# Patient Record
Sex: Female | Born: 1980 | Race: White | Hispanic: No | Marital: Married | Smoking: Never smoker
Health system: Southern US, Community
[De-identification: ages and names within clinical notes are randomized; demographics above are authoritative.]

## PROBLEM LIST (undated history)

## (undated) HISTORY — PX: TUBAL LIGATION: SHX77

---

## 2006-08-18 ENCOUNTER — Observation Stay: Payer: Self-pay

## 2006-11-09 ENCOUNTER — Observation Stay: Payer: Self-pay | Admitting: Obstetrics & Gynecology

## 2006-12-20 ENCOUNTER — Inpatient Hospital Stay: Payer: Self-pay

## 2007-02-11 ENCOUNTER — Emergency Department: Payer: Self-pay | Admitting: Unknown Physician Specialty

## 2009-04-21 ENCOUNTER — Ambulatory Visit: Payer: Self-pay | Admitting: Obstetrics and Gynecology

## 2009-04-26 ENCOUNTER — Emergency Department: Payer: Self-pay | Admitting: Internal Medicine

## 2009-04-27 ENCOUNTER — Ambulatory Visit: Payer: Self-pay | Admitting: Internal Medicine

## 2009-11-22 ENCOUNTER — Observation Stay: Payer: Self-pay

## 2009-11-30 ENCOUNTER — Ambulatory Visit: Payer: Self-pay

## 2009-12-01 ENCOUNTER — Inpatient Hospital Stay: Payer: Self-pay

## 2010-05-08 ENCOUNTER — Emergency Department: Payer: Self-pay | Admitting: Emergency Medicine

## 2011-11-05 ENCOUNTER — Ambulatory Visit: Payer: Self-pay | Admitting: Family Medicine

## 2014-11-03 ENCOUNTER — Ambulatory Visit (INDEPENDENT_AMBULATORY_CARE_PROVIDER_SITE_OTHER): Payer: BLUE CROSS/BLUE SHIELD

## 2014-11-03 ENCOUNTER — Encounter: Payer: Self-pay | Admitting: Podiatry

## 2014-11-03 ENCOUNTER — Ambulatory Visit (INDEPENDENT_AMBULATORY_CARE_PROVIDER_SITE_OTHER): Payer: BLUE CROSS/BLUE SHIELD | Admitting: Podiatry

## 2014-11-03 VITALS — BP 99/66 | HR 66 | Resp 12

## 2014-11-03 DIAGNOSIS — R52 Pain, unspecified: Secondary | ICD-10-CM

## 2014-11-03 MED ORDER — MELOXICAM 15 MG PO TABS: 15.0000 mg | ORAL_TABLET | Freq: Every day | ORAL | Status: DC

## 2014-11-03 MED ORDER — METHYLPREDNISOLONE (PAK) 4 MG PO TABS: ORAL_TABLET | ORAL | Status: DC

## 2014-11-03 NOTE — Progress Notes (Signed)
   Subjective:    Patient ID: Lori Hendricks, female    DOB: 07/17/1981, 34 y.o.   MRN: [id number]  HPI  PT STATED B/L BOTTOM OF THE HEELS AND LT TOP OF THE FOOT IS BEEN PAINFUL FOR 4 MONTHS. FEET ARE GETTING PAINFUL ESPECIALLY GETTING UP IN THE MORNING OR FLEXING WHEN DRIVING. TRIED MASSAGE AND GOOD SHOES BUT NO HELP.  Review of Systems  All other systems reviewed and are negative.      Objective:   Physical Exam: I have reviewed her past mental history medications allergies surgery social history and review of systems. Pulses are strongly palpable bilateral. Neurologic sensorium is intact. Deep tendon reflex are intact. Muscle strength is 5 over 5 dorsiflexion plantar flexors and inverters everters all into the musculature is intact. Orthopedic evaluation demonstrates pain on palpation central band of plantar fascia bilateral left greater than right she also has pain on palpation of the sinus tarsi of the left foot with mild tenderness on end range of motion of the subtalar joint left. Radiographic evaluation does demonstrate what appears to be a chronic inflammatory event to the plantar fascial calcaneal insertion sites bilateral.        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  Plan: We injected the left heel today and place her on a Medrol Dosepak to be followed by meloxicam. He has a night splint at home which she will utilize and we dispensed a plantar fascial day brace. I will follow-up with her in the near future for discussion.

## 2014-11-24 ENCOUNTER — Encounter: Payer: Self-pay | Admitting: Podiatry

## 2014-11-24 ENCOUNTER — Ambulatory Visit (INDEPENDENT_AMBULATORY_CARE_PROVIDER_SITE_OTHER): Payer: BLUE CROSS/BLUE SHIELD | Admitting: Podiatry

## 2014-11-24 VITALS — BP 100/63 | HR 62 | Resp 16

## 2014-11-24 DIAGNOSIS — M722 Plantar fascial fibromatosis: Secondary | ICD-10-CM | POA: Diagnosis not present

## 2014-11-25 NOTE — Progress Notes (Signed)
She presents today for a follow-up of plantar fasciitis. She states that one of her braces broke and she would like to consider purchasing to night splints. She states that she is approximately 70-75% improved however she is not well as of yet.  Objective: Vital signs are stable she is alert and oriented 3 mild tenderness on palpation of the medial calcaneal tubercles bilateral. There is no erythema edema or ecchymosis.  Assessment: Plantar fasciitis bilateral.  Plan: Encouraged her to continue all of her anti-inflammatories all of her conservative therapies until she is 1 month pain-free. I will follow-up with her as needed. We dispensed to night splints for her.

## 2014-12-16 ENCOUNTER — Ambulatory Visit: Payer: Self-pay | Admitting: Endocrinology

## 2014-12-27 ENCOUNTER — Encounter: Payer: Self-pay | Admitting: Podiatry

## 2014-12-27 ENCOUNTER — Ambulatory Visit (INDEPENDENT_AMBULATORY_CARE_PROVIDER_SITE_OTHER): Payer: BLUE CROSS/BLUE SHIELD | Admitting: Podiatry

## 2014-12-27 VITALS — BP 105/67 | HR 64 | Resp 16 | Ht 61.0 in

## 2014-12-27 DIAGNOSIS — M722 Plantar fascial fibromatosis: Secondary | ICD-10-CM

## 2014-12-27 MED ORDER — DICLOFENAC SODIUM 75 MG PO TBEC: 75.0000 mg | DELAYED_RELEASE_TABLET | Freq: Two times a day (BID) | ORAL | Status: AC

## 2014-12-27 NOTE — Progress Notes (Signed)
At this point she states that her left foot is regressing 75% at her last visit to approximately 50% at this point. She states that my right heel is now starting to hurt.  Objective: Vital signs are stable she is alert and oriented 3. Pulses are strongly palpable bilateral. Pain on palpation medial calcaneal tubercles bilateral.  Assessment: Plantar fasciitis bilateral left greater than right.  Plan: She was scanned today for set up her orthotics. I will follow-up with her in 1 month when those come in for an injection of the bilateral heels. I also will discontinue her meloxicam and start her on diclofenac.

## 2014-12-31 ENCOUNTER — Ambulatory Visit
Admit: 2014-12-31 | Disposition: A | Discharge status: Home / Self Care | Payer: Self-pay | Attending: Family Medicine | Admitting: Family Medicine

## 2014-12-31 LAB — CBC WITH DIFFERENTIAL/PLATELET
BASOS ABS: 0 10*3/uL (ref 0.0–0.1)
Basophil %: 0.3 %
Eosinophil #: 0.2 10*3/uL (ref 0.0–0.7)
Eosinophil %: 1.6 %
HCT: 37.7 % (ref 35.0–47.0)
HGB: 12.5 g/dL (ref 12.0–16.0)
LYMPHS ABS: 2.4 10*3/uL (ref 1.0–3.6)
Lymphocyte %: 18.7 %
MCH: 29.2 pg (ref 26.0–34.0)
MCHC: 33.1 g/dL (ref 32.0–36.0)
MCV: 88 fL (ref 80–100)
MONO ABS: 0.7 x10 3/mm (ref 0.2–0.9)
Monocyte %: 5.5 %
Neutrophil #: 9.4 10*3/uL — ABNORMAL HIGH (ref 1.4–6.5)
Neutrophil %: 73.9 %
PLATELETS: 292 10*3/uL (ref 150–440)
RBC: 4.27 10*6/uL (ref 3.80–5.20)
RDW: 13.3 % (ref 11.5–14.5)
WBC: 12.7 10*3/uL — AB (ref 3.6–11.0)

## 2014-12-31 LAB — URINALYSIS, COMPLETE
Blood: NEGATIVE
Glucose,UR: NEGATIVE
Leukocyte Esterase: NEGATIVE
Nitrite: NEGATIVE
PH: 6 (ref 5.0–8.0)
PROTEIN: NEGATIVE
SPECIFIC GRAVITY: 1.025 (ref 1.000–1.030)

## 2014-12-31 LAB — COMPREHENSIVE METABOLIC PANEL
ANION GAP: 10 (ref 7–16)
AST: 22 U/L
Albumin: 4.5 g/dL
Alkaline Phosphatase: 10 U/L — ABNORMAL LOW
BILIRUBIN TOTAL: 0.6 mg/dL
BUN: 12 mg/dL
CALCIUM: 9.5 mg/dL
CHLORIDE: 102 mmol/L
Co2: 24 mmol/L
Creatinine: 0.75 mg/dL
EGFR (African American): >60
GLUCOSE: 96 mg/dL
POTASSIUM: 4.2 mmol/L
SGPT (ALT): 17 U/L
SODIUM: 136 mmol/L
Total Protein: 7.5 g/dL

## 2014-12-31 LAB — AMYLASE: Amylase: 24 U/L — ABNORMAL LOW

## 2014-12-31 LAB — LIPASE, BLOOD: LIPASE: 23 U/L

## 2014-12-31 LAB — PREGNANCY, URINE: Pregnancy Test, Urine: NEGATIVE m[IU]/mL

## 2015-01-01 ENCOUNTER — Emergency Department
Admit: 2015-01-01 | Disposition: A | Discharge status: Home / Self Care | Payer: Self-pay | Admitting: Emergency Medicine

## 2015-01-01 LAB — COMPREHENSIVE METABOLIC PANEL
ALBUMIN: 4 g/dL
ALT: 18 U/L
AST: 23 U/L
Alkaline Phosphatase: 11 U/L — ABNORMAL LOW
Anion Gap: 10 (ref 7–16)
BILIRUBIN TOTAL: 0.6 mg/dL
BUN: 11 mg/dL
CREATININE: 0.83 mg/dL
Calcium, Total: 9.4 mg/dL
Chloride: 103 mmol/L
Co2: 25 mmol/L
EGFR (African American): >60
GLUCOSE: 89 mg/dL
POTASSIUM: 4 mmol/L
SODIUM: 138 mmol/L
TOTAL PROTEIN: 7.3 g/dL

## 2015-01-01 LAB — URINALYSIS, COMPLETE
BILIRUBIN, UR: NEGATIVE
Blood: NEGATIVE
Glucose,UR: NEGATIVE mg/dL (ref 0–75)
KETONE: NEGATIVE
LEUKOCYTE ESTERASE: NEGATIVE
Nitrite: NEGATIVE
Ph: 6 (ref 4.5–8.0)
Protein: NEGATIVE
Specific Gravity: 1.016 (ref 1.003–1.030)

## 2015-01-01 LAB — CBC WITH DIFFERENTIAL/PLATELET
BASOS ABS: 0 10*3/uL (ref 0.0–0.1)
BASOS PCT: 0.7 %
Eosinophil #: 0.3 10*3/uL (ref 0.0–0.7)
Eosinophil %: 4.1 %
HCT: 38.6 % (ref 35.0–47.0)
HGB: 12.6 g/dL (ref 12.0–16.0)
LYMPHS ABS: 1.8 10*3/uL (ref 1.0–3.6)
Lymphocyte %: 27.8 %
MCH: 29.2 pg (ref 26.0–34.0)
MCHC: 32.7 g/dL (ref 32.0–36.0)
MCV: 89 fL (ref 80–100)
MONO ABS: 0.4 x10 3/mm (ref 0.2–0.9)
MONOS PCT: 6 %
NEUTROS ABS: 4.1 10*3/uL (ref 1.4–6.5)
Neutrophil %: 61.4 %
Platelet: 305 10*3/uL (ref 150–440)
RBC: 4.33 10*6/uL (ref 3.80–5.20)
RDW: 13.6 % (ref 11.5–14.5)
WBC: 6.6 10*3/uL (ref 3.6–11.0)

## 2015-01-01 LAB — LIPASE, BLOOD: Lipase: 48 U/L

## 2015-01-07 ENCOUNTER — Ambulatory Visit (INDEPENDENT_AMBULATORY_CARE_PROVIDER_SITE_OTHER): Payer: BLUE CROSS/BLUE SHIELD | Admitting: Podiatry

## 2015-01-07 DIAGNOSIS — M722 Plantar fascial fibromatosis: Secondary | ICD-10-CM

## 2015-01-07 MED ORDER — TRIAMCINOLONE ACETONIDE 10 MG/ML IJ SUSP
10.0000 mg | Freq: Once | INTRAMUSCULAR | Status: AC
  Administered 2015-01-07: 10 mg

## 2015-01-07 NOTE — Patient Instructions (Signed)

## 2015-01-10 NOTE — Progress Notes (Signed)
Subjective:     Patient ID: Lori Hendricks, female   DOB: 09/16/1981, 34 y.o.   MRN: [id number]  HPI patient presents with pain in the heels of both feet stating that she should've got injections as lateral visit as they are really bothering her   Review of Systems     Objective:   Physical Exam Neurovascular status intact with severe discomfort plantar aspect heel bilateral with inflammation and fluid around the medial band at its insertion into the calcaneus    Assessment:     Plantar fasciitis bilateral with inflammation and fluid buildup    Plan:     Injected the plantar fascia bilateral 3 mg Kenalog 5 mg Xylocaine and instructed on physical therapy. Reappoint for us to recheck again in the next several weeks and begin orthotic usage with instructions

## 2015-01-17 ENCOUNTER — Ambulatory Visit: Payer: BLUE CROSS/BLUE SHIELD | Admitting: Podiatry

## 2015-02-09 ENCOUNTER — Ambulatory Visit (INDEPENDENT_AMBULATORY_CARE_PROVIDER_SITE_OTHER): Payer: BLUE CROSS/BLUE SHIELD | Admitting: Podiatry

## 2015-02-09 DIAGNOSIS — M722 Plantar fascial fibromatosis: Secondary | ICD-10-CM | POA: Diagnosis not present

## 2015-02-09 MED ORDER — MELOXICAM 15 MG PO TABS: 15.0000 mg | ORAL_TABLET | Freq: Every day | ORAL | Status: DC

## 2015-02-09 NOTE — Progress Notes (Signed)
She presents today for follow-up of her plantar fasciitis bilateral. She states that they still hurt some but nothing like that. She continues to wear her orthotics or regular basis and she states that she loves them. She was unable to take the diclofenac with severe abdominal pain. She is requesting meloxicam once again.  Objective: Vital signs are stable she is alert and oriented 3. Pulses are palpable bilateral. She is no pain on palpation medial calcaneal tubercles bilateral.  Assessment: Well-healing plantar fasciitis bilateral.  Plan: Wrote her another prescription for 90 days worth of meloxicam. Continue to wear the orthotics on a regular basis. Follow up with me as needed.

## 2015-03-23 ENCOUNTER — Ambulatory Visit: Payer: BLUE CROSS/BLUE SHIELD | Admitting: Podiatry

## 2015-06-14 ENCOUNTER — Telehealth: Payer: Self-pay | Admitting: *Deleted

## 2015-06-14 MED ORDER — MELOXICAM 15 MG PO TABS: 15.0000 mg | ORAL_TABLET | Freq: Every day | ORAL | Status: AC

## 2015-06-14 NOTE — Telephone Encounter (Addendum)
Pt states her pharmacy has not been able to get the Meloxicam refilled.  I left message informing pt I had her pharmacy on record as [facility] and [facility]  I contacted the pharmacy and pt has refills available, they do not understand pt's complaint.  I informed pt I would refill the rx for a year in [place], but she would need to call.

## 2016-12-27 ENCOUNTER — Telehealth: Payer: Self-pay | Admitting: Obstetrics & Gynecology

## 2016-12-27 NOTE — Telephone Encounter (Signed)
Pt needs an Appt. For [employer] Prescription not authorized due to Dr. Luella Cook no longer with [facility].

## 2016-12-28 ENCOUNTER — Ambulatory Visit: Payer: BLUE CROSS/BLUE SHIELD | Admitting: Obstetrics and Gynecology

## 2017-11-18 ENCOUNTER — Other Ambulatory Visit: Payer: Self-pay

## 2017-11-18 ENCOUNTER — Ambulatory Visit
Admission: EM | Admit: 2017-11-18 | Discharge: 2017-11-18 | Disposition: A | Discharge status: Home / Self Care | Payer: BLUE CROSS/BLUE SHIELD | Attending: Family Medicine | Admitting: Family Medicine

## 2017-11-18 ENCOUNTER — Encounter: Payer: Self-pay | Admitting: Emergency Medicine

## 2017-11-18 DIAGNOSIS — K1121 Acute sialoadenitis: Secondary | ICD-10-CM

## 2017-11-18 DIAGNOSIS — R6884 Jaw pain: Secondary | ICD-10-CM | POA: Diagnosis not present

## 2017-11-18 MED ORDER — AMOXICILLIN-POT CLAVULANATE 875-125 MG PO TABS: 1.0000 | ORAL_TABLET | Freq: Two times a day (BID) | ORAL | 0 refills | Status: AC

## 2017-11-18 NOTE — ED Triage Notes (Signed)
Patient states she has been sick with the flu for the past week and a half. Saturday her right jaw started swelling and became painful.

## 2017-11-18 NOTE — ED Provider Notes (Signed)
[facility]    CSN: [id number] Arrival date & time: 11/18/17  0804     History   Chief Complaint Chief Complaint  Patient presents with  . Jaw Pain    HPI Lori Hendricks is a 37 y.o. female.   37 yo female with a c/o right sided jaw pain and swelling for the past 2 days. Denies any injuries, trauma. Had the flu last week.    The history is provided by the patient.    History reviewed. No pertinent past medical history.  There are no active problems to display for this patient.   Past Surgical History:  Procedure Laterality Date  . REPEAT CESAREAN SECTION    . TUBAL LIGATION      OB History    No data available       Home Medications    Prior to Admission medications   Medication Sig Start Date End Date Taking? Authorizing Provider  norelgestromin-ethinyl estradiol (ORTHO EVRA) 150-35 MCG/24HR transdermal patch Place 1 patch onto the skin once a week.   Yes [provider]  paroxetine (PAXIL) 10 MG/5ML suspension Take by mouth every morning.   Yes [provider]  amoxicillin-clavulanate (AUGMENTIN) 875-125 MG tablet Take 1 tablet by mouth 2 (two) times daily. 11/18/17   Payton Mccallumonty, Rafael Quesada, MD  diclofenac (VOLTAREN) 75 MG EC tablet Take 1 tablet (75 mg total) by mouth 2 (two) times daily. Patient not taking: Reported on 01/07/2015 12/27/14   Ernestene KielHyatt, Max T, DPM  meloxicam (MOBIC) 15 MG tablet Take 1 tablet (15 mg total) by mouth daily. 06/14/15   Hyatt, Max T, DPM  naproxen (NAPROSYN) 500 MG tablet Take 500 mg by mouth 2 (two) times daily with a meal.    [provider]  norelgestromin-ethinyl estradiol (ORTHO EVRA) 150-35 MCG/24HR transdermal patch Place 1 patch onto the skin once a week.    [provider]    Family History Family History  Problem Relation Age of Onset  . Cancer Father   . Hyperlipidemia Father     Social History Social History   Tobacco Use  . Smoking status: Never Smoker  . Smokeless  tobacco: Never Used  Substance Use Topics  . Alcohol use: No  . Drug use: No     Allergies   Diclofenac   Review of Systems Review of Systems   Physical Exam Triage Vital Signs ED Triage Vitals  Enc Vitals Group     BP 11/18/17 0825 105/82     Pulse Rate 11/18/17 0825 72     Resp 11/18/17 0825 18     Temp 11/18/17 0825 98.3 F (36.8 C)     Temp Source 11/18/17 0825 Oral     SpO2 11/18/17 0825 97 %     Weight 11/18/17 0820 156 lb (70.8 kg)     Height 11/18/17 0820 5' (1.524 m)     Head Circumference --      Peak Flow --      Pain Score 11/18/17 0820 6     Pain Loc --      Pain Edu? --      Excl. in GC? --    No data found.  Updated Vital Signs BP 105/82 (BP Location: Left Arm)   Pulse 72   Temp 98.3 F (36.8 C) (Oral)   Resp 18   Ht 5' (1.524 m)   Wt 156 lb (70.8 kg)   LMP 11/06/2017   SpO2 97%   BMI 30.47  kg/m   Visual Acuity Right Eye Distance:   Left Eye Distance:   Bilateral Distance:    Right Eye Near:   Left Eye Near:    Bilateral Near:     Physical Exam  Constitutional: She appears well-developed and well-nourished. No distress.  HENT:  Head: Normocephalic and atraumatic.    Right Ear: External ear normal.  Left Ear: External ear normal.  Nose: Nose normal.  Mouth/Throat: Oropharynx is clear and moist. No oropharyngeal exudate.  Swelling and tenderness to palpation over the right parotid gland  Neck: Normal range of motion.  Cardiovascular: Normal rate, regular rhythm and normal heart sounds.  Pulmonary/Chest: Effort normal and breath sounds normal. No stridor. No respiratory distress. She has no wheezes. She has no rales.  Skin: She is not diaphoretic.  Nursing note and vitals reviewed.    UC Treatments / Results  Labs (all labs ordered are listed, but only abnormal results are displayed) Labs Reviewed - No data to display  EKG  EKG Interpretation None       Radiology No results found.  Procedures Procedures  (including critical care time)  Medications Ordered in UC Medications - No data to display   Initial Impression / Assessment and Plan / UC Course  I have reviewed the triage vital signs and the nursing notes.  Pertinent labs & imaging results that were available during my care of the patient were reviewed by me and considered in my medical decision making (see chart for details).       Final Clinical Impressions(s) / UC Diagnoses   Final diagnoses:  Parotitis, acute    ED Discharge Orders        Ordered    amoxicillin-clavulanate (AUGMENTIN) 875-125 MG tablet  2 times daily     11/18/17 0841     1. diagnosis reviewed with patient 2. rx as per orders above; reviewed possible side effects, interactions, risks and benefits  3. Recommend supportive treatment with rest, fluids, analgesics prn 4. Follow-up prn if symptoms worsen or don't improve  Controlled Substance Prescriptions  Controlled Substance Registry consulted? Not Applicable   Payton Mccallum, MD 11/18/17 [phone]

## 2018-01-29 ENCOUNTER — Other Ambulatory Visit: Payer: Self-pay | Admitting: Obstetrics & Gynecology

## 2018-01-29 DIAGNOSIS — N6320 Unspecified lump in the left breast, unspecified quadrant: Principal | ICD-10-CM

## 2018-01-29 DIAGNOSIS — N6325 Unspecified lump in the left breast, overlapping quadrants: Secondary | ICD-10-CM

## 2018-02-06 ENCOUNTER — Ambulatory Visit
Admission: RE | Admit: 2018-02-06 | Discharge: 2018-02-06 | Disposition: A | Discharge status: Home / Self Care | Payer: BLUE CROSS/BLUE SHIELD | Source: Ambulatory Visit | Attending: Obstetrics & Gynecology | Admitting: Obstetrics & Gynecology

## 2018-02-06 DIAGNOSIS — N6325 Unspecified lump in the left breast, overlapping quadrants: Secondary | ICD-10-CM

## 2018-02-06 DIAGNOSIS — N6320 Unspecified lump in the left breast, unspecified quadrant: Secondary | ICD-10-CM | POA: Diagnosis present

## 2020-03-04 ENCOUNTER — Encounter: Payer: Self-pay | Admitting: Emergency Medicine

## 2020-03-04 ENCOUNTER — Ambulatory Visit
Admission: EM | Admit: 2020-03-04 | Discharge: 2020-03-04 | Disposition: A | Discharge status: Home / Self Care | Payer: BC Managed Care – PPO | Attending: Family Medicine | Admitting: Family Medicine

## 2020-03-04 ENCOUNTER — Other Ambulatory Visit: Payer: Self-pay

## 2020-03-04 DIAGNOSIS — M436 Torticollis: Secondary | ICD-10-CM

## 2020-03-04 MED ORDER — KETOROLAC TROMETHAMINE 10 MG PO TABS: 10.0000 mg | ORAL_TABLET | Freq: Three times a day (TID) | ORAL | 0 refills | Status: AC | PRN

## 2020-03-04 MED ORDER — CYCLOBENZAPRINE HCL 10 MG PO TABS: 10.0000 mg | ORAL_TABLET | Freq: Three times a day (TID) | ORAL | 0 refills | Status: AC | PRN

## 2020-03-04 MED ORDER — KETOROLAC TROMETHAMINE 60 MG/2ML IM SOLN
60.0000 mg | Freq: Once | INTRAMUSCULAR | Status: AC
  Administered 2020-03-04: 60 mg via INTRAMUSCULAR

## 2020-03-04 NOTE — ED Provider Notes (Signed)
[facility]    CSN: [id number] Arrival date & time: 03/04/20  0846      History   Chief Complaint Chief Complaint  Patient presents with  . Neck Pain  . Shoulder Pain    right    HPI Lori Hendricks is a 39 y.o. female.   40 yo female with a c/o right sided neck and upper back pain since yesterday. States she woke up with a "crook on the neck and thought it would just go away". Denies any falls or any other injuries. Denies any fevers, chills, rash, numbness/tingling, unilateral weakness.    Neck Pain Shoulder Pain Associated symptoms: neck pain     History reviewed. No pertinent past medical history.  There are no problems to display for this patient.   Past Surgical History:  Procedure Laterality Date  . REPEAT CESAREAN SECTION    . TUBAL LIGATION      OB History   No obstetric history on file.      Home Medications    Prior to Admission medications   Medication Sig Start Date End Date Taking? Authorizing Provider  ergocalciferol (VITAMIN D2) 1.25 MG (50000 UT) capsule TAKE 1 CAPSULE BY MOUTH ONE TIME PER WEEK 03/20/19  Yes [provider]  norelgestromin-ethinyl estradiol (ORTHO EVRA) 150-35 MCG/24HR transdermal patch Place 1 patch onto the skin once a week.   Yes [provider]  paroxetine (PAXIL) 10 MG/5ML suspension Take by mouth every morning.   Yes [provider]  amoxicillin-clavulanate (AUGMENTIN) 875-125 MG tablet Take 1 tablet by mouth 2 (two) times daily. 11/18/17   Norval Gable, MD  cyclobenzaprine (FLEXERIL) 10 MG tablet Take 1 tablet (10 mg total) by mouth 3 (three) times daily as needed for muscle spasms. 03/04/20   Norval Gable, MD  diclofenac (VOLTAREN) 75 MG EC tablet Take 1 tablet (75 mg total) by mouth 2 (two) times daily. Patient not taking: Reported on 01/07/2015 12/27/14   Tyson Dense T, DPM  ketorolac (TORADOL) 10 MG tablet Take 1 tablet (10 mg total) by mouth every 8 (eight) hours as  needed. 03/04/20   Norval Gable, MD  meloxicam (MOBIC) 15 MG tablet Take 1 tablet (15 mg total) by mouth daily. 06/14/15   Hyatt, Max T, DPM  naproxen (NAPROSYN) 500 MG tablet Take 500 mg by mouth 2 (two) times daily with a meal.    [provider]  norelgestromin-ethinyl estradiol (ORTHO EVRA) 150-35 MCG/24HR transdermal patch Place 1 patch onto the skin once a week.    [provider]    Family History Family History  Problem Relation Age of Onset  . Cancer Father   . Hyperlipidemia Father     Social History Social History   Tobacco Use  . Smoking status: Never Smoker  . Smokeless tobacco: Never Used  Vaping Use  . Vaping Use: Never used  Substance Use Topics  . Alcohol use: No  . Drug use: No     Allergies   Diclofenac   Review of Systems Review of Systems  Musculoskeletal: Positive for neck pain.     Physical Exam Triage Vital Signs ED Triage Vitals  Enc Vitals Group     BP 03/04/20 0903 (!) 111/54     Pulse Rate 03/04/20 0903 72     Resp 03/04/20 0903 18     Temp 03/04/20 0903 98.7 F (37.1 C)     Temp Source 03/04/20 0903 Oral     SpO2 03/04/20 0903 99 %  Weight 03/04/20 0859 166 lb (75.3 kg)     Height 03/04/20 0859 5' (1.524 m)     Head Circumference --      Peak Flow --      Pain Score 03/04/20 0859 3     Pain Loc --      Pain Edu? --      Excl. in GC? --    No data found.  Updated Vital Signs BP (!) 111/54 (BP Location: Right Arm)   Pulse 72   Temp 98.7 F (37.1 C) (Oral)   Resp 18   Ht 5' (1.524 m)   Wt 75.3 kg   LMP 02/16/2020 (Approximate)   SpO2 99%   BMI 32.42 kg/m   Visual Acuity Right Eye Distance:   Left Eye Distance:   Bilateral Distance:    Right Eye Near:   Left Eye Near:    Bilateral Near:     Physical Exam Vitals and nursing note reviewed.  Constitutional:      General: She is not in acute distress.    Appearance: She is not toxic-appearing or diaphoretic.  Musculoskeletal:     Cervical  back: Spasms (right trapezius), torticollis and tenderness (right trapezius) present. No swelling, edema, deformity, erythema, signs of trauma, lacerations, rigidity, bony tenderness or crepitus. Pain with movement present. Decreased range of motion.  Neurological:     Mental Status: She is alert.      UC Treatments / Results  Labs (all labs ordered are listed, but only abnormal results are displayed) Labs Reviewed - No data to display  EKG   Radiology No results found.  Procedures Procedures (including critical care time)  Medications Ordered in UC Medications  ketorolac (TORADOL) injection 60 mg (60 mg Intramuscular Given 03/04/20 0936)    Initial Impression / Assessment and Plan / UC Course  I have reviewed the triage vital signs and the nursing notes.  Pertinent labs & imaging results that were available during my care of the patient were reviewed by me and considered in my medical decision making (see chart for details).     Final Clinical Impressions(s) / UC Diagnoses   Final diagnoses:  Torticollis   Discharge Instructions   None    ED Prescriptions    Medication Sig Dispense Auth. Provider   ketorolac (TORADOL) 10 MG tablet Take 1 tablet (10 mg total) by mouth every 8 (eight) hours as needed. 15 tablet Payton Mccallum, MD   cyclobenzaprine (FLEXERIL) 10 MG tablet Take 1 tablet (10 mg total) by mouth 3 (three) times daily as needed for muscle spasms. 30 tablet Payton Mccallum, MD      1. diagnosis reviewed with patient 2. Given toradol 60mg  IM x1 3. rx as per orders above; reviewed possible side effects, interactions, risks and benefits  4. Recommend supportive treatment with rest, easy/gentle stretching, heat to area 5. Follow-up prn if symptoms worsen or don't improve  PDMP not reviewed this encounter.   , MD 03/04/20 [phone]

## 2020-03-04 NOTE — ED Triage Notes (Signed)
Pt c/o right sided neck and shoulder pain. Started yesterday. She states she woke up with the pain. She can not turn her head.

## 2022-01-31 ENCOUNTER — Other Ambulatory Visit: Payer: Self-pay | Admitting: Emergency Medicine

## 2022-01-31 DIAGNOSIS — Z1231 Encounter for screening mammogram for malignant neoplasm of breast: Secondary | ICD-10-CM

## 2022-02-28 ENCOUNTER — Ambulatory Visit
Admission: RE | Admit: 2022-02-28 | Discharge: 2022-02-28 | Disposition: A | Discharge status: Home / Self Care | Payer: BC Managed Care – PPO | Source: Ambulatory Visit | Attending: Emergency Medicine | Admitting: Emergency Medicine

## 2022-02-28 DIAGNOSIS — Z1231 Encounter for screening mammogram for malignant neoplasm of breast: Secondary | ICD-10-CM | POA: Diagnosis present

## 2022-08-18 IMAGING — MG MM DIGITAL SCREENING BILAT W/ TOMO AND CAD
8 series · 8 of 24 positions shown · non-contrast
Comparison: Previous exam(s).

CLINICAL DATA: Screening.

EXAM:
DIGITAL SCREENING BILATERAL MAMMOGRAM WITH TOMOSYNTHESIS AND CAD
TECHNIQUE: Bilateral screening digital craniocaudal and mediolateral oblique
mammograms were obtained. Bilateral screening digital breast
tomosynthesis was performed. The images were evaluated with
computer-aided detection.

[R CC synth-2D]
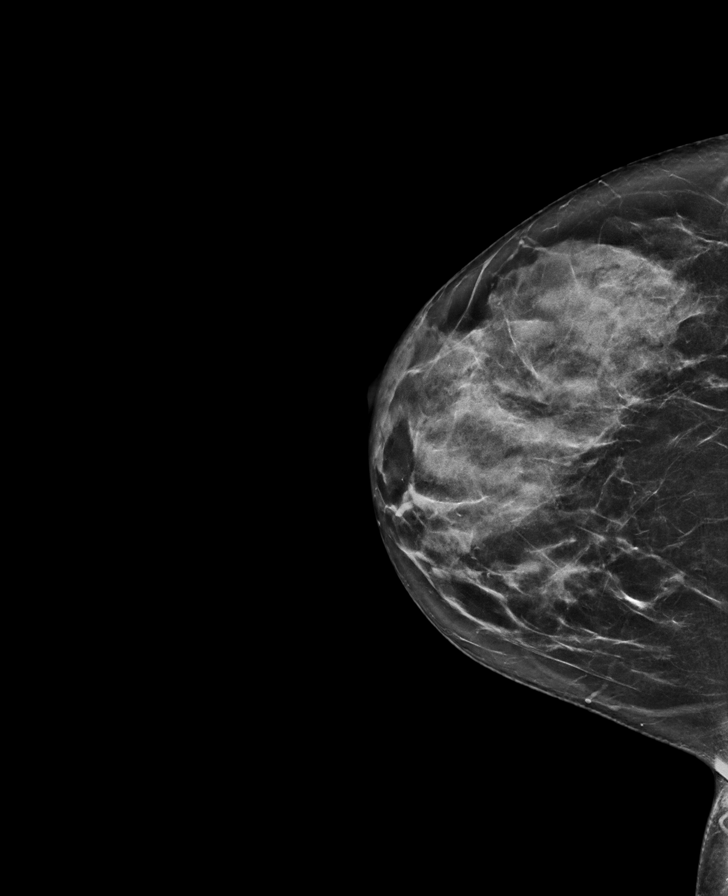

[R MLO synth-2D]
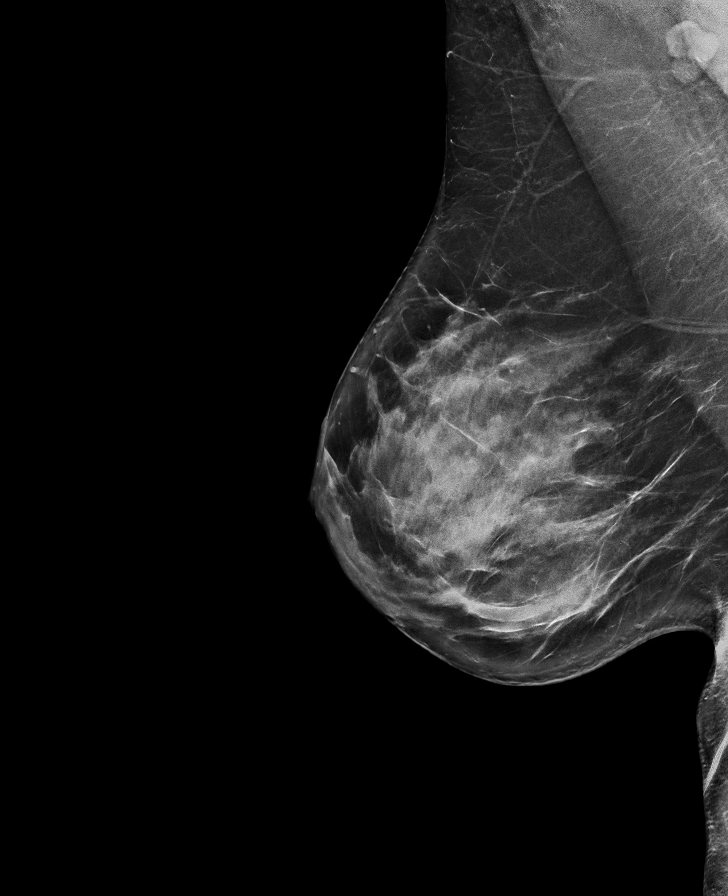

[L CC synth-2D]
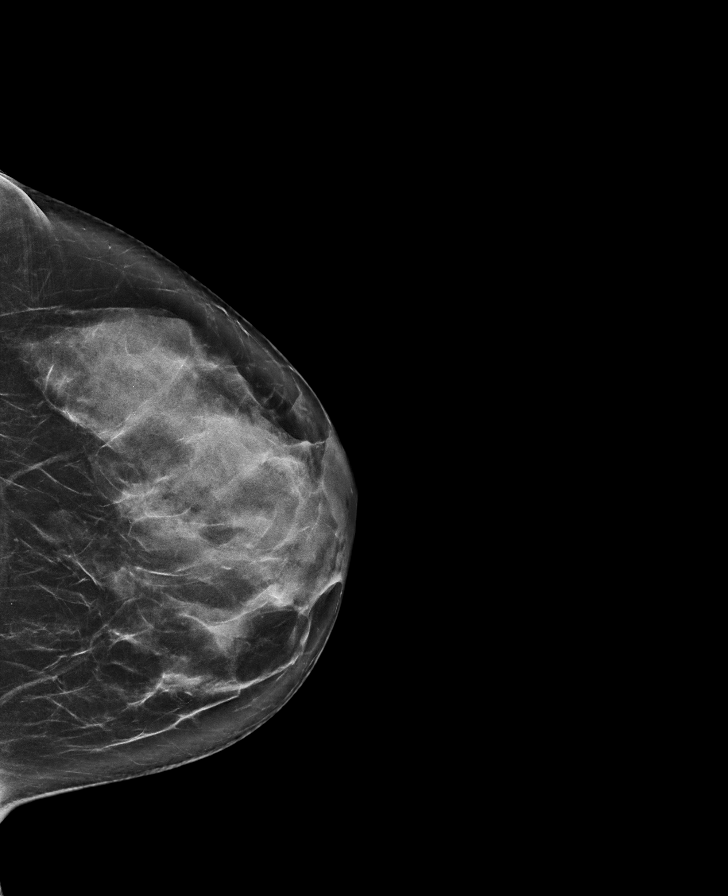

[L MLO synth-2D]
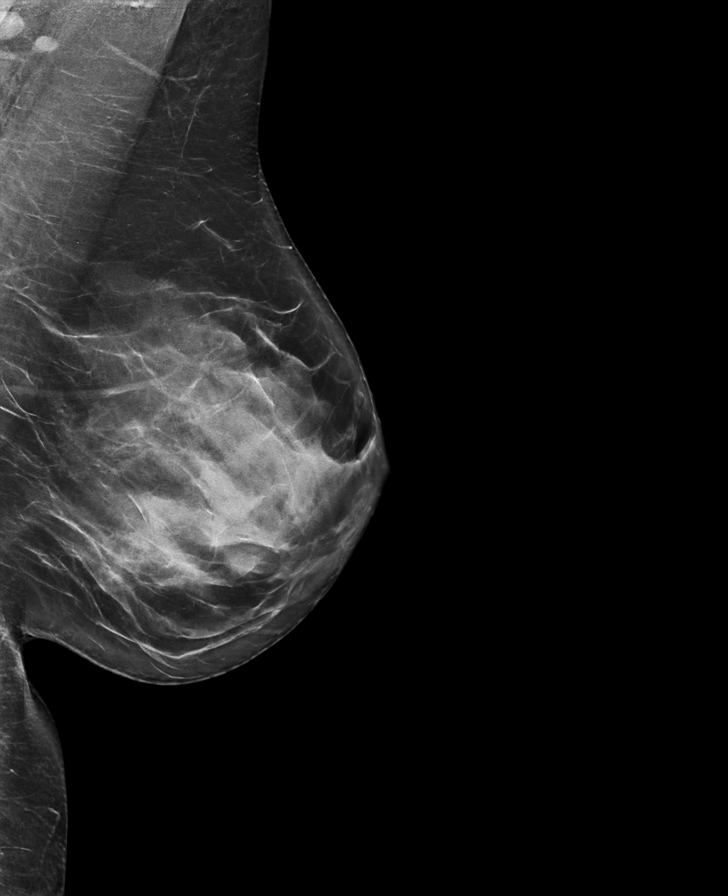

[R CC tomo · tomo slice 35/70.0]
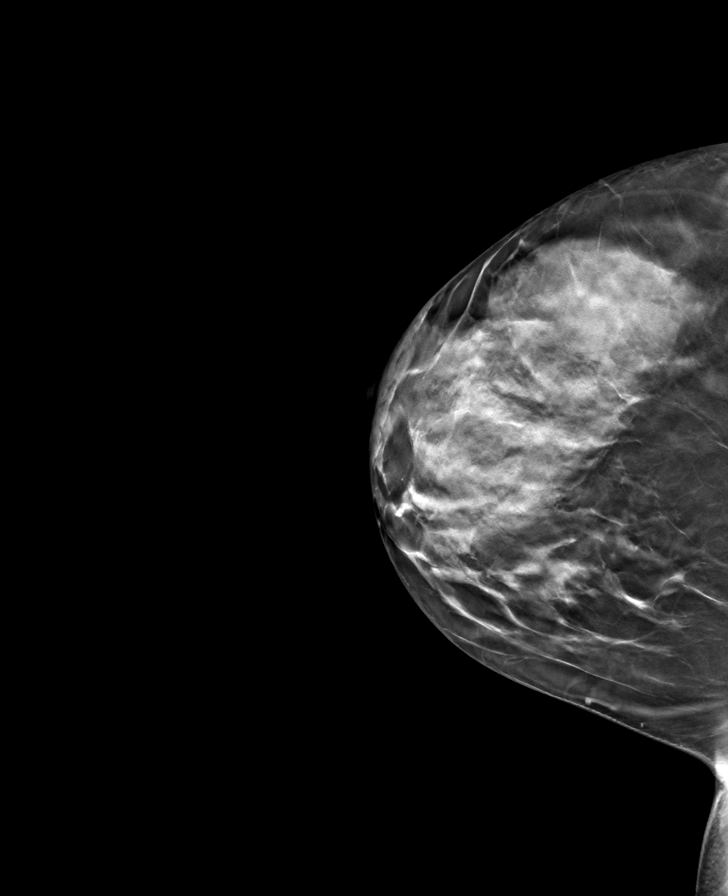

[R MLO tomo · tomo slice 41/81.0]
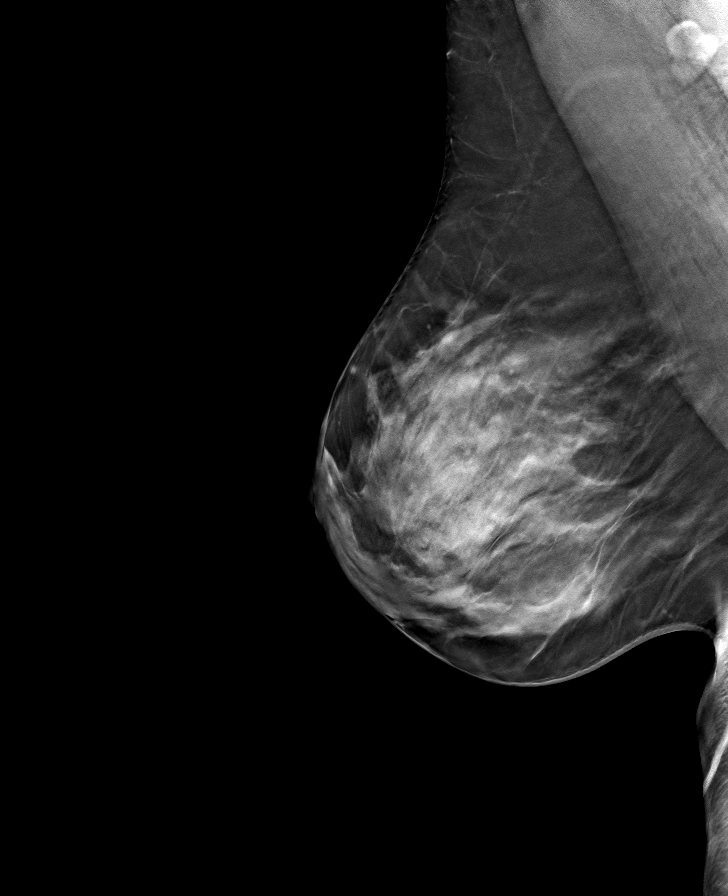

[L CC tomo · tomo slice 39/77.0]
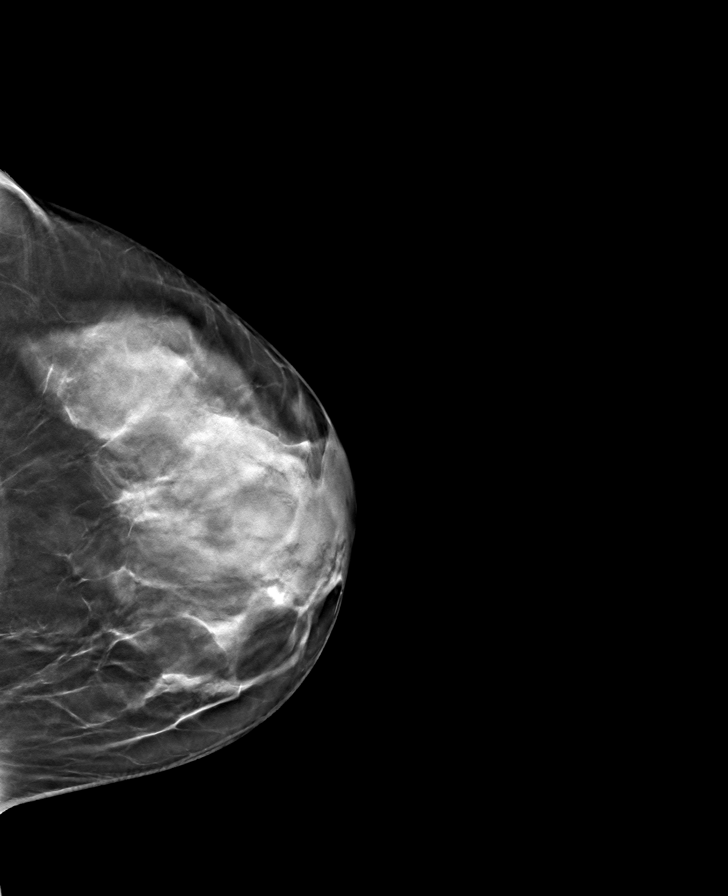

[L MLO tomo · tomo slice 41/82.0]
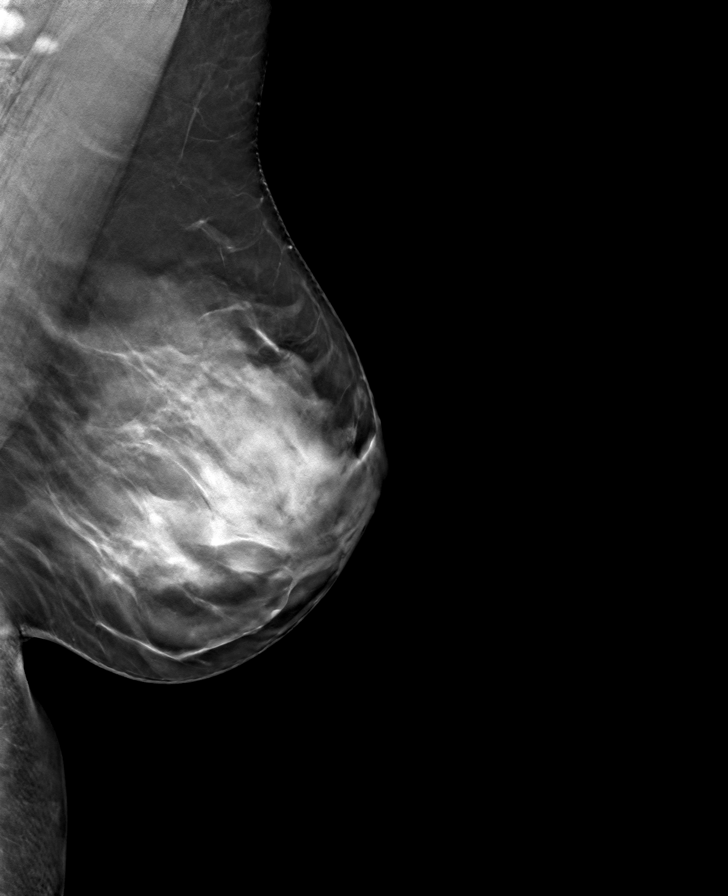

[8 of 24 positions shown; findings below may reference images not displayed]

ACR Breast Density Category d: The breast tissue is extremely dense,
which lowers the sensitivity of mammography
FINDINGS: There are no findings suspicious for malignancy.
IMPRESSION: No mammographic evidence of malignancy. A result letter of this
screening mammogram will be mailed directly to the patient.

RECOMMENDATION:
Screening mammogram in one year. (Code:TA-V-WV9)

BI-RADS CATEGORY  1: Negative.

## 2023-07-02 ENCOUNTER — Other Ambulatory Visit: Payer: Self-pay | Admitting: Emergency Medicine

## 2023-07-02 DIAGNOSIS — Z1231 Encounter for screening mammogram for malignant neoplasm of breast: Secondary | ICD-10-CM

## 2023-07-31 ENCOUNTER — Ambulatory Visit
Admission: RE | Admit: 2023-07-31 | Discharge: 2023-07-31 | Disposition: A | Discharge status: Home / Self Care | Payer: BC Managed Care – PPO | Source: Ambulatory Visit | Attending: Emergency Medicine | Admitting: Emergency Medicine

## 2023-07-31 DIAGNOSIS — Z1231 Encounter for screening mammogram for malignant neoplasm of breast: Secondary | ICD-10-CM | POA: Insufficient documentation

## 2024-07-27 ENCOUNTER — Other Ambulatory Visit: Payer: Self-pay | Admitting: Emergency Medicine

## 2024-07-27 DIAGNOSIS — Z1231 Encounter for screening mammogram for malignant neoplasm of breast: Secondary | ICD-10-CM

## 2024-09-03 ENCOUNTER — Ambulatory Visit
Admission: RE | Admit: 2024-09-03 | Discharge: 2024-09-03 | Disposition: A | Discharge status: Home / Self Care | Source: Ambulatory Visit | Attending: Emergency Medicine | Admitting: Emergency Medicine

## 2024-09-03 DIAGNOSIS — Z1231 Encounter for screening mammogram for malignant neoplasm of breast: Secondary | ICD-10-CM | POA: Diagnosis present
